# Patient Record
Sex: Male | Born: 1975 | Race: White | Hispanic: No | Smoking: Never smoker
Health system: Southern US, Community
[De-identification: ages and names within clinical notes are randomized; demographics above are authoritative.]

---

## 2015-01-08 ENCOUNTER — Encounter (HOSPITAL_COMMUNITY): Payer: Self-pay | Admitting: *Deleted

## 2015-01-08 ENCOUNTER — Emergency Department (HOSPITAL_COMMUNITY): Payer: BLUE CROSS/BLUE SHIELD

## 2015-01-08 DIAGNOSIS — Z880 Allergy status to penicillin: Secondary | ICD-10-CM | POA: Insufficient documentation

## 2015-01-08 DIAGNOSIS — R079 Chest pain, unspecified: Secondary | ICD-10-CM | POA: Diagnosis not present

## 2015-01-08 DIAGNOSIS — R1013 Epigastric pain: Secondary | ICD-10-CM | POA: Insufficient documentation

## 2015-01-08 DIAGNOSIS — R1110 Vomiting, unspecified: Secondary | ICD-10-CM | POA: Diagnosis not present

## 2015-01-08 DIAGNOSIS — R000 Tachycardia, unspecified: Secondary | ICD-10-CM | POA: Insufficient documentation

## 2015-01-08 DIAGNOSIS — R6883 Chills (without fever): Secondary | ICD-10-CM | POA: Diagnosis not present

## 2015-01-08 LAB — CBC
HEMATOCRIT: 44.6 % (ref 39.0–52.0)
HEMOGLOBIN: 15.6 g/dL (ref 13.0–17.0)
MCH: 29.4 pg (ref 26.0–34.0)
MCHC: 35 g/dL (ref 30.0–36.0)
MCV: 84.2 fL (ref 78.0–100.0)
Platelets: 172 10*3/uL (ref 150–400)
RBC: 5.3 MIL/uL (ref 4.22–5.81)
RDW: 12.4 % (ref 11.5–15.5)
WBC: 7.6 10*3/uL (ref 4.0–10.5)

## 2015-01-08 NOTE — ED Notes (Signed)
The pt is c/o cedntral chest pain since 1800 today.  Dizziness nausea sob.  He has had a cold that started today.  Very anxious  Constant sniffing

## 2015-01-09 ENCOUNTER — Encounter (HOSPITAL_COMMUNITY): Payer: Self-pay

## 2015-01-09 ENCOUNTER — Emergency Department (HOSPITAL_COMMUNITY): Payer: BLUE CROSS/BLUE SHIELD

## 2015-01-09 ENCOUNTER — Emergency Department (HOSPITAL_COMMUNITY)
Admission: EM | Admit: 2015-01-09 | Discharge: 2015-01-09 | Disposition: A | Discharge status: Home / Self Care | Payer: BLUE CROSS/BLUE SHIELD | Attending: Emergency Medicine | Admitting: Emergency Medicine

## 2015-01-09 DIAGNOSIS — R079 Chest pain, unspecified: Secondary | ICD-10-CM

## 2015-01-09 LAB — BASIC METABOLIC PANEL WITH GFR
Anion gap: 10 (ref 5–15)
BUN: 11 mg/dL (ref 6–20)
CO2: 23 mmol/L (ref 22–32)
Calcium: 9.3 mg/dL (ref 8.9–10.3)
Chloride: 105 mmol/L (ref 101–111)
Creatinine, Ser: 1.08 mg/dL (ref 0.61–1.24)
GFR calc Af Amer: >60 mL/min
GFR calc non Af Amer: >60 mL/min
Glucose, Bld: 116 mg/dL — ABNORMAL HIGH (ref 65–99)
Potassium: 3.3 mmol/L — ABNORMAL LOW (ref 3.5–5.1)
Sodium: 138 mmol/L (ref 135–145)

## 2015-01-09 LAB — D-DIMER, QUANTITATIVE: D-Dimer, Quant: 1.43 ug{FEU}/mL — ABNORMAL HIGH (ref 0.00–0.50)

## 2015-01-09 LAB — TROPONIN I: Troponin I: <0.03 ng/mL

## 2015-01-09 MED ORDER — IOHEXOL 350 MG/ML SOLN
100.0000 mL | Freq: Once | INTRAVENOUS | Status: AC | PRN
  Administered 2015-01-09: 60 mL via INTRAVENOUS

## 2015-01-09 MED ORDER — SODIUM CHLORIDE 0.9 % IV BOLUS (SEPSIS)
2000.0000 mL | Freq: Once | INTRAVENOUS | Status: AC
  Administered 2015-01-09: 2000 mL via INTRAVENOUS

## 2015-01-09 MED ORDER — KETOROLAC TROMETHAMINE 30 MG/ML IJ SOLN
30.0000 mg | Freq: Once | INTRAMUSCULAR | Status: AC
  Administered 2015-01-09: 30 mg via INTRAVENOUS
  Filled 2015-01-09: qty 1

## 2015-01-09 MED ORDER — GI COCKTAIL ~~LOC~~
30.0000 mL | Freq: Once | ORAL | Status: AC
  Administered 2015-01-09: 30 mL via ORAL
  Filled 2015-01-09: qty 30

## 2015-01-09 NOTE — ED Provider Notes (Signed)
CSN: [id number]     Arrival date & time 01/08/15  2311 History  By signing my name below, I, Soijett Blue, attest that this documentation has been prepared under the direction and in the presence of Melene Plan, DO. Electronically Signed: Soijett Blue, ED Scribe. 01/09/2015. 1:19 AM.   Chief Complaint  Patient presents with  . Chest Pain      The history is provided by the patient. No language interpreter was used.     HPI Comments: Lonnie Stewart is a 40 y.o. male who presents to the Emergency Department complaining of constant, non-radiating, squeezing, CP onset 6 PM today. He notes that he was getting off work when the CP began.  when the CP began.He states that he is having associated symptoms of abdominal pain, chills, and vomiting. He states that he has not tried any medications for the relief for his symptoms. He denies cough, congestion, hemoptysis, leg swelling, and any other symptoms. Blood clot, CA, recent long travel or immobilizations, or surgeries. Denies having CP in the past and denies sick contacts at this time. Denies smoking cigarettes, blood clots, HTN, high cholesterol, or DM at this time.   History reviewed. No pertinent past medical history. History reviewed. No pertinent past surgical history. No family history on file. Social History  Substance Use Topics  . Smoking status: Never Smoker   . Smokeless tobacco: None  . Alcohol Use: Yes    Review of Systems  Constitutional: Positive for chills. Negative for fever.  HENT: Negative for congestion and facial swelling.   Eyes: Negative for discharge and visual disturbance.  Respiratory: Negative for cough and shortness of breath.   Cardiovascular: Positive for chest pain. Negative for palpitations and leg swelling.  Gastrointestinal: Positive for vomiting. Negative for abdominal pain and diarrhea.  Musculoskeletal: Negative for myalgias and arthralgias.  Skin: Negative for color change and rash.  Neurological:  Negative for tremors, syncope and headaches.  Psychiatric/Behavioral: Negative for confusion and dysphoric mood.      Allergies  Penicillins  Home Medications   Prior to Admission medications   Not on File   BP 132/88 mmHg  Pulse 99  Temp(Src) 98.4 F (36.9 C)  Resp 18  Ht 6\' 1"  (1.854 m)  Wt 209 lb 4 oz (94.915 kg)  BMI 27.61 kg/m2  SpO2 100% Physical Exam  Constitutional: He is oriented to person, place, and time. He appears well-developed and well-nourished.  HENT:  Head: Normocephalic and atraumatic.  Eyes: EOM are normal. Pupils are equal, round, and reactive to light.  Neck: Normal range of motion. Neck supple. No JVD present.  Cardiovascular: Regular rhythm and normal heart sounds.  Tachycardia present.  Exam reveals no gallop and no friction rub.   No murmur heard. Equal peripheral pulses bilaterally.   Pulmonary/Chest: Effort normal and breath sounds normal. No respiratory distress. He has no wheezes. He has no rales.  Mild sternal tenderness. Lungs clear to auscultation.   Abdominal: Soft. He exhibits no distension. There is tenderness in the epigastric area. There is no rebound and no guarding.  Musculoskeletal: Normal range of motion.  No lower extremity edema  Neurological: He is alert and oriented to person, place, and time.  Skin: No rash noted. No pallor.  Psychiatric: He has a normal mood and affect. His behavior is normal.  Nursing note and vitals reviewed.   ED Course  Procedures (including critical care time) DIAGNOSTIC STUDIES: Oxygen Saturation is 97% on RA, nl by my interpretation.  COORDINATION OF CARE: 1:19 AM Discussed treatment plan with pt at bedside which includes labs, CXR and pt agreed to plan.    Labs Review Labs Reviewed  BASIC METABOLIC PANEL - Abnormal; Notable for the following:    Potassium 3.3 (*)    Glucose, Bld 116 (*)    All other components within normal limits  D-DIMER, QUANTITATIVE (NOT [facility]) - Abnormal;  Notable for the following:    D-Dimer, Quant 1.43 (*)    All other components within normal limits  CBC  TROPONIN I    Imaging Review Dg Chest 2 View  01/09/2015  CLINICAL DATA:  Acute onset of mid chest tightness and upper abdominal pain. Initial encounter. EXAM: CHEST  2 VIEW COMPARISON:  None. FINDINGS: The lungs are well-aerated and clear. There is no evidence of focal opacification, pleural effusion or pneumothorax. The heart is normal in size; the mediastinal contour is within normal limits. No acute osseous abnormalities are seen. IMPRESSION: No acute cardiopulmonary process seen. Electronically Signed   By: Roanna RaiderJeffery  Chang M.D.   On: 01/09/2015 00:09   Ct Angio Chest Pe W/cm &/or Wo Cm  01/09/2015  CLINICAL DATA:  Central chest pain for 1 day. Clinical concern for pulmonary embolus. EXAM: CT ANGIOGRAPHY CHEST WITH CONTRAST TECHNIQUE: Multidetector CT imaging of the chest was performed using the standard protocol during bolus administration of intravenous contrast. Multiplanar CT image reconstructions and MIPs were obtained to evaluate the vascular anatomy. CONTRAST:  60mL OMNIPAQUE IOHEXOL 350 MG/ML SOLN COMPARISON:  Chest radiographs 1 day prior. FINDINGS: Due to scanner malfunction, initial imaging terminated at the level of the lower mediastinum, partially excluding evaluation of the subsegmental branches of the lower lobes. Allowing for this, there are no filling defects within the pulmonary arteries to suggest pulmonary embolus. Thoracic aorta is normal in caliber. There is a conventional branching pattern from the aortic arch. The heart is normal in size. There is no mediastinal or hilar adenopathy. No pleural or pericardial effusion. Lower lobe bronchial thickening with depending ground-glass opacities, no confluent airspace disease. No pulmonary mass or suspicious nodule. Evaluation of the upper abdomen demonstrates splenomegaly, spleen measures at least 14.9 cm, partially included. No  evident acute abnormality. There are no acute or suspicious osseous abnormalities. Review of the MIP images confirms the above findings. IMPRESSION: 1. No evidence pulmonary embolus, allowing for limited evaluation of the distal subsegmental branches in the lower lobe secondary to technical limitations. 2. Central bronchial thickening. 3. Incidental note of splenomegaly in the upper abdomen, partially included. Electronically Signed   By: Rubye OaksMelanie  Ehinger M.D.   On: 01/09/2015 03:59   I have personally reviewed and evaluated these images and lab results as part of my medical decision-making.   EKG Interpretation   Date/Time:  Tuesday January 08 2015 23:17:21 EST Ventricular Rate:  146 PR Interval:  124 QRS Duration: 88 QT Interval:  280 QTC Calculation: 436 R Axis:   105 Text Interpretation:  Sinus tachycardia Rightward axis Borderline ECG No  old tracing to compare Confirmed by Johm Pfannenstiel MD, DANIEL [phone]) on 01/08/2015  11:23:06 PM      MDM   Final diagnoses:  Chest pain, unspecified chest pain type    40 yo M with a cc of chest pain.  Feels like a central pressure.  Denies injury.  Significant tachycardia on arrival.  Sinus.  Improved without intervention, though still in 110 range.  Denies dehydration.  No PE risk factors.  DDimer +, PE scan negative.  Patients symptoms completely relieved with GI cocktail, suspect reflux.  Will start on zantac, PCP follow up.   I have discussed the diagnosis/risks/treatment options with the patient and family and believe the pt to be eligible for discharge home to follow-up with PCP. We also discussed returning to the ED immediately if new or worsening sx occur. We discussed the sx which are most concerning (e.g., sudden worsening pain, fever, inability to tolerate by mouth) that necessitate immediate return. Medications administered to the patient during their visit and any new prescriptions provided to the patient are listed below.  Medications given  during this visit Medications  sodium chloride 0.9 % bolus 2,000 mL (0 mLs Intravenous Stopped 01/09/15 0316)  ketorolac (TORADOL) 30 MG/ML injection 30 mg (30 mg Intravenous Given 01/09/15 0139)  gi cocktail (Maalox,Lidocaine,Donnatal) (30 mLs Oral Given 01/09/15 0139)  iohexol (OMNIPAQUE) 350 MG/ML injection 100 mL (60 mLs Intravenous Contrast Given 01/09/15 0330)    There are no discharge medications for this patient.   The patient appears reasonably screen and/or stabilized for discharge and I doubt any other medical condition or other [facility] requiring further screening, evaluation, or treatment in the ED at this time prior to discharge.     I personally performed the services described in this documentation, which was scribed in my presence. The recorded information has been reviewed and is accurate.     Melene Plan, DO 01/09/15 1745

## 2015-01-09 NOTE — Discharge Instructions (Signed)
Try zantac 150mg  twice a day. Nonspecific Chest Pain It is often hard to find the cause of chest pain. There is always a chance that your pain could be related to something serious, such as a heart attack or a blood clot in your lungs. Chest pain can also be caused by conditions that are not life-threatening. If you have chest pain, it is very important to follow up with your doctor.  HOME CARE  If you were prescribed an antibiotic medicine, finish it all even if you start to feel better.  Avoid any activities that cause chest pain.  Do not use any tobacco products, including cigarettes, chewing tobacco, or electronic cigarettes. If you need help quitting, ask your doctor.  Do not drink alcohol.  Take medicines only as told by your doctor.  Keep all follow-up visits as told by your doctor. This is important. This includes any further testing if your chest pain does not go away.  Your doctor may tell you to keep your head raised (elevated) while you sleep.  Make lifestyle changes as told by your doctor. These may include:  Getting regular exercise. Ask your doctor to suggest some activities that are safe for you.  Eating a heart-healthy diet. Your doctor or a diet specialist (dietitian) can help you to learn healthy eating options.  Maintaining a healthy weight.  Managing diabetes, if necessary.  Reducing stress. GET HELP IF:  Your chest pain does not go away, even after treatment.  You have a rash with blisters on your chest.  You have a fever. GET HELP RIGHT AWAY IF:  Your chest pain is worse.  You have an increasing cough, or you cough up blood.  You have severe belly (abdominal) pain.  You feel extremely weak.  You pass out (faint).  You have chills.  You have sudden, unexplained chest discomfort.  You have sudden, unexplained discomfort in your arms, back, neck, or jaw.  You have shortness of breath at any time.  You suddenly start to sweat, or your skin  gets clammy.  You feel nauseous.  You vomit.  You suddenly feel light-headed or dizzy.  Your heart begins to beat quickly, or it feels like it is skipping beats. These symptoms may be an emergency. Do not wait to see if the symptoms will go away. Get medical help right away. Call your local emergency services (911 in the [place] Do not drive yourself to the hospital.   This information is not intended to replace advice given to you by your health care provider. Make sure you discuss any questions you have with your health care provider.   Document Released: 06/10/2007 Document Revised: 01/12/2014 Document Reviewed: 07/28/2013 Elsevier Interactive Patient Education [employer]2016 Elsevier Inc.

## 2015-01-09 NOTE — ED Notes (Signed)
Patient transported to CT 

## 2015-01-18 ENCOUNTER — Ambulatory Visit: Payer: Self-pay | Admitting: *Deleted

## 2017-12-28 ENCOUNTER — Emergency Department (HOSPITAL_COMMUNITY): Payer: BLUE CROSS/BLUE SHIELD

## 2017-12-28 ENCOUNTER — Observation Stay (HOSPITAL_COMMUNITY): Payer: BLUE CROSS/BLUE SHIELD | Admitting: Certified Registered"

## 2017-12-28 ENCOUNTER — Encounter (HOSPITAL_COMMUNITY): Payer: Self-pay | Admitting: Emergency Medicine

## 2017-12-28 ENCOUNTER — Other Ambulatory Visit: Payer: Self-pay

## 2017-12-28 ENCOUNTER — Observation Stay (HOSPITAL_COMMUNITY)
Admission: EM | Admit: 2017-12-28 | Discharge: 2017-12-29 | Disposition: A | Discharge status: Home / Self Care | Payer: BLUE CROSS/BLUE SHIELD | Attending: Surgery | Admitting: Surgery

## 2017-12-28 ENCOUNTER — Encounter (HOSPITAL_COMMUNITY)
Admission: EM | Disposition: A | Discharge status: Home / Self Care | Payer: Self-pay | Source: Home / Self Care | Attending: Emergency Medicine

## 2017-12-28 DIAGNOSIS — K37 Unspecified appendicitis: Secondary | ICD-10-CM | POA: Diagnosis present

## 2017-12-28 DIAGNOSIS — Z880 Allergy status to penicillin: Secondary | ICD-10-CM | POA: Insufficient documentation

## 2017-12-28 DIAGNOSIS — K3580 Unspecified acute appendicitis: Principal | ICD-10-CM | POA: Insufficient documentation

## 2017-12-28 HISTORY — PX: LAPAROSCOPIC APPENDECTOMY: SHX408

## 2017-12-28 LAB — CBC WITH DIFFERENTIAL/PLATELET
ABS IMMATURE GRANULOCYTES: 0.06 10*3/uL (ref 0.00–0.07)
BASOS ABS: 0 10*3/uL (ref 0.0–0.1)
Basophils Relative: 0 %
Eosinophils Absolute: 0.1 10*3/uL (ref 0.0–0.5)
Eosinophils Relative: 0 %
HCT: 44.9 % (ref 39.0–52.0)
Hemoglobin: 15 g/dL (ref 13.0–17.0)
Immature Granulocytes: 0 %
LYMPHS ABS: 1.1 10*3/uL (ref 0.7–4.0)
LYMPHS PCT: 8 %
MCH: 28.7 pg (ref 26.0–34.0)
MCHC: 33.4 g/dL (ref 30.0–36.0)
MCV: 86 fL (ref 80.0–100.0)
MONO ABS: 1.2 10*3/uL — AB (ref 0.1–1.0)
Monocytes Relative: 8 %
Neutro Abs: 12.2 10*3/uL — ABNORMAL HIGH (ref 1.7–7.7)
Neutrophils Relative %: 84 %
Platelets: 193 10*3/uL (ref 150–400)
RBC: 5.22 MIL/uL (ref 4.22–5.81)
RDW: 12 % (ref 11.5–15.5)
WBC: 14.7 10*3/uL — AB (ref 4.0–10.5)
nRBC: 0 % (ref 0.0–0.2)

## 2017-12-28 LAB — COMPREHENSIVE METABOLIC PANEL
ALBUMIN: 3.7 g/dL (ref 3.5–5.0)
ALT: 28 U/L (ref 0–44)
AST: 20 U/L (ref 15–41)
Alkaline Phosphatase: 60 U/L (ref 38–126)
Anion gap: 12 (ref 5–15)
BUN: 15 mg/dL (ref 6–20)
CHLORIDE: 104 mmol/L (ref 98–111)
CO2: 22 mmol/L (ref 22–32)
CREATININE: 1.17 mg/dL (ref 0.61–1.24)
Calcium: 8.7 mg/dL — ABNORMAL LOW (ref 8.9–10.3)
GFR calc non Af Amer: >60 mL/min (ref >60)
Glucose, Bld: 110 mg/dL — ABNORMAL HIGH (ref 70–99)
Potassium: 3.4 mmol/L — ABNORMAL LOW (ref 3.5–5.1)
Sodium: 138 mmol/L (ref 135–145)
Total Bilirubin: 1.7 mg/dL — ABNORMAL HIGH (ref 0.3–1.2)
Total Protein: 6.8 g/dL (ref 6.5–8.1)

## 2017-12-28 LAB — I-STAT CG4 LACTIC ACID, ED: LACTIC ACID, VENOUS: 1.76 mmol/L (ref 0.5–1.9)

## 2017-12-28 LAB — LIPASE, BLOOD: Lipase: 24 U/L (ref 11–51)

## 2017-12-28 SURGERY — APPENDECTOMY, LAPAROSCOPIC
Anesthesia: General | Site: Abdomen

## 2017-12-28 MED ORDER — HYDROMORPHONE HCL 1 MG/ML IJ SOLN
1.0000 mg | Freq: Once | INTRAMUSCULAR | Status: AC
  Administered 2017-12-28: 1 mg via INTRAVENOUS
  Filled 2017-12-28: qty 1

## 2017-12-28 MED ORDER — SODIUM CHLORIDE 0.9 % IV SOLN
2.0000 g | INTRAVENOUS | Status: DC
  Administered 2017-12-28: 2 g via INTRAVENOUS
  Filled 2017-12-28 (×2): qty 20

## 2017-12-28 MED ORDER — SUCCINYLCHOLINE CHLORIDE 200 MG/10ML IV SOSY
PREFILLED_SYRINGE | INTRAVENOUS | Status: DC | PRN
  Administered 2017-12-28: 120 mg via INTRAVENOUS

## 2017-12-28 MED ORDER — ONDANSETRON HCL 4 MG/2ML IJ SOLN
INTRAMUSCULAR | Status: AC
  Filled 2017-12-28: qty 2

## 2017-12-28 MED ORDER — OXYCODONE HCL 5 MG PO TABS
5.0000 mg | ORAL_TABLET | Freq: Once | ORAL | Status: AC | PRN
  Administered 2017-12-28: 5 mg via ORAL

## 2017-12-28 MED ORDER — KETOROLAC TROMETHAMINE 30 MG/ML IJ SOLN
INTRAMUSCULAR | Status: AC
  Filled 2017-12-28: qty 1

## 2017-12-28 MED ORDER — PROPOFOL 10 MG/ML IV BOLUS
INTRAVENOUS | Status: DC | PRN
  Administered 2017-12-28: 150 mg via INTRAVENOUS

## 2017-12-28 MED ORDER — KETOROLAC TROMETHAMINE 30 MG/ML IJ SOLN
30.0000 mg | Freq: Once | INTRAMUSCULAR | Status: AC | PRN
  Administered 2017-12-28: 30 mg via INTRAVENOUS

## 2017-12-28 MED ORDER — HYDROMORPHONE HCL 1 MG/ML IJ SOLN: 0.2500 mg | INTRAMUSCULAR | Status: DC | PRN

## 2017-12-28 MED ORDER — SODIUM CHLORIDE 0.9 % IV SOLN
1000.0000 mL | INTRAVENOUS | Status: DC
  Administered 2017-12-28: 500 mL via INTRAVENOUS

## 2017-12-28 MED ORDER — ESMOLOL HCL 100 MG/10ML IV SOLN
INTRAVENOUS | Status: DC | PRN
  Administered 2017-12-28: 15 mg via INTRAVENOUS

## 2017-12-28 MED ORDER — PROMETHAZINE HCL 25 MG/ML IJ SOLN: 6.2500 mg | INTRAMUSCULAR | Status: DC | PRN

## 2017-12-28 MED ORDER — FENTANYL CITRATE (PF) 250 MCG/5ML IJ SOLN
INTRAMUSCULAR | Status: AC
  Filled 2017-12-28: qty 5

## 2017-12-28 MED ORDER — ONDANSETRON 4 MG PO TBDP: 4.0000 mg | ORAL_TABLET | Freq: Four times a day (QID) | ORAL | Status: DC | PRN

## 2017-12-28 MED ORDER — SODIUM CHLORIDE 0.9 % IV SOLN
INTRAVENOUS | Status: DC
  Administered 2017-12-28 – 2017-12-29 (×2): via INTRAVENOUS

## 2017-12-28 MED ORDER — METRONIDAZOLE IN NACL 5-0.79 MG/ML-% IV SOLN
500.0000 mg | Freq: Once | INTRAVENOUS | Status: AC
  Administered 2017-12-28: 500 mg via INTRAVENOUS
  Filled 2017-12-28: qty 100

## 2017-12-28 MED ORDER — ACETAMINOPHEN 10 MG/ML IV SOLN
INTRAVENOUS | Status: AC
  Filled 2017-12-28: qty 100

## 2017-12-28 MED ORDER — SODIUM CHLORIDE 0.9 % IV BOLUS (SEPSIS)
1000.0000 mL | Freq: Once | INTRAVENOUS | Status: AC
  Administered 2017-12-28: 1000 mL via INTRAVENOUS

## 2017-12-28 MED ORDER — SODIUM CHLORIDE 0.9 % IV BOLUS
1000.0000 mL | Freq: Once | INTRAVENOUS | Status: AC
  Administered 2017-12-28: 1000 mL via INTRAVENOUS

## 2017-12-28 MED ORDER — KETOROLAC TROMETHAMINE 30 MG/ML IJ SOLN: 30.0000 mg | Freq: Four times a day (QID) | INTRAMUSCULAR | Status: DC | PRN

## 2017-12-28 MED ORDER — DEXAMETHASONE SODIUM PHOSPHATE 10 MG/ML IJ SOLN
INTRAMUSCULAR | Status: DC | PRN
  Administered 2017-12-28: 10 mg via INTRAVENOUS

## 2017-12-28 MED ORDER — MIDAZOLAM HCL 2 MG/2ML IJ SOLN
INTRAMUSCULAR | Status: DC | PRN
  Administered 2017-12-28: 2 mg via INTRAVENOUS

## 2017-12-28 MED ORDER — OXYCODONE HCL 5 MG/5ML PO SOLN: 5.0000 mg | Freq: Once | ORAL | Status: AC | PRN

## 2017-12-28 MED ORDER — HYDRALAZINE HCL 20 MG/ML IJ SOLN: 10.0000 mg | INTRAMUSCULAR | Status: DC | PRN

## 2017-12-28 MED ORDER — METOPROLOL TARTRATE 5 MG/5ML IV SOLN: 5.0000 mg | Freq: Four times a day (QID) | INTRAVENOUS | Status: DC | PRN

## 2017-12-28 MED ORDER — BUPIVACAINE-EPINEPHRINE 0.25% -1:200000 IJ SOLN
INTRAMUSCULAR | Status: DC | PRN
  Administered 2017-12-28: 10 mL

## 2017-12-28 MED ORDER — FENTANYL CITRATE (PF) 250 MCG/5ML IJ SOLN
INTRAMUSCULAR | Status: DC | PRN
  Administered 2017-12-28: 50 ug via INTRAVENOUS

## 2017-12-28 MED ORDER — SUGAMMADEX SODIUM 200 MG/2ML IV SOLN
INTRAVENOUS | Status: DC | PRN
  Administered 2017-12-28: 200 mg via INTRAVENOUS

## 2017-12-28 MED ORDER — ONDANSETRON HCL 4 MG/2ML IJ SOLN
INTRAMUSCULAR | Status: DC | PRN
  Administered 2017-12-28: 4 mg via INTRAVENOUS

## 2017-12-28 MED ORDER — METHOCARBAMOL 500 MG PO TABS: 500.0000 mg | ORAL_TABLET | Freq: Four times a day (QID) | ORAL | Status: DC | PRN

## 2017-12-28 MED ORDER — ONDANSETRON HCL 4 MG/2ML IJ SOLN: 4.0000 mg | Freq: Four times a day (QID) | INTRAMUSCULAR | Status: DC | PRN

## 2017-12-28 MED ORDER — DOCUSATE SODIUM 100 MG PO CAPS
100.0000 mg | ORAL_CAPSULE | Freq: Two times a day (BID) | ORAL | Status: DC
  Administered 2017-12-28 – 2017-12-29 (×2): 100 mg via ORAL
  Filled 2017-12-28 (×2): qty 1

## 2017-12-28 MED ORDER — ACETAMINOPHEN 500 MG PO TABS
1000.0000 mg | ORAL_TABLET | Freq: Four times a day (QID) | ORAL | Status: DC
  Administered 2017-12-29 (×3): 1000 mg via ORAL
  Filled 2017-12-28 (×3): qty 2

## 2017-12-28 MED ORDER — BUPIVACAINE-EPINEPHRINE (PF) 0.25% -1:200000 IJ SOLN
INTRAMUSCULAR | Status: AC
  Filled 2017-12-28: qty 30

## 2017-12-28 MED ORDER — LIDOCAINE 2% (20 MG/ML) 5 ML SYRINGE
INTRAMUSCULAR | Status: DC | PRN
  Administered 2017-12-28: 60 mg via INTRAVENOUS

## 2017-12-28 MED ORDER — 0.9 % SODIUM CHLORIDE (POUR BTL) OPTIME
TOPICAL | Status: DC | PRN
  Administered 2017-12-28: 1000 mL

## 2017-12-28 MED ORDER — ROCURONIUM BROMIDE 10 MG/ML (PF) SYRINGE
PREFILLED_SYRINGE | INTRAVENOUS | Status: DC | PRN
  Administered 2017-12-28: 50 mg via INTRAVENOUS

## 2017-12-28 MED ORDER — IOHEXOL 300 MG/ML  SOLN
100.0000 mL | Freq: Once | INTRAMUSCULAR | Status: AC | PRN
  Administered 2017-12-28: 100 mL via INTRAVENOUS

## 2017-12-28 MED ORDER — DIPHENHYDRAMINE HCL 25 MG PO CAPS: 25.0000 mg | ORAL_CAPSULE | Freq: Four times a day (QID) | ORAL | Status: DC | PRN

## 2017-12-28 MED ORDER — ONDANSETRON HCL 4 MG/2ML IJ SOLN
4.0000 mg | Freq: Once | INTRAMUSCULAR | Status: AC
  Administered 2017-12-28: 4 mg via INTRAVENOUS
  Filled 2017-12-28: qty 2

## 2017-12-28 MED ORDER — TRAMADOL HCL 50 MG PO TABS
50.0000 mg | ORAL_TABLET | Freq: Four times a day (QID) | ORAL | Status: DC | PRN
  Administered 2017-12-29: 50 mg via ORAL
  Filled 2017-12-28: qty 1

## 2017-12-28 MED ORDER — CIPROFLOXACIN IN D5W 400 MG/200ML IV SOLN
400.0000 mg | Freq: Once | INTRAVENOUS | Status: AC
Start: 2017-12-28 — End: 2017-12-28
  Administered 2017-12-28: 400 mg via INTRAVENOUS
  Filled 2017-12-28: qty 200

## 2017-12-28 MED ORDER — DEXAMETHASONE SODIUM PHOSPHATE 10 MG/ML IJ SOLN
INTRAMUSCULAR | Status: AC
  Filled 2017-12-28: qty 1

## 2017-12-28 MED ORDER — LACTATED RINGERS IV SOLN
INTRAVENOUS | Status: DC | PRN
  Administered 2017-12-28 (×2): via INTRAVENOUS

## 2017-12-28 MED ORDER — METRONIDAZOLE IN NACL 5-0.79 MG/ML-% IV SOLN
500.0000 mg | Freq: Three times a day (TID) | INTRAVENOUS | Status: DC
  Administered 2017-12-28 – 2017-12-29 (×3): 500 mg via INTRAVENOUS
  Filled 2017-12-28 (×3): qty 100

## 2017-12-28 MED ORDER — ENOXAPARIN SODIUM 40 MG/0.4ML ~~LOC~~ SOLN
40.0000 mg | SUBCUTANEOUS | Status: DC
  Filled 2017-12-28: qty 0.4

## 2017-12-28 MED ORDER — BISACODYL 10 MG RE SUPP: 10.0000 mg | Freq: Every day | RECTAL | Status: DC | PRN

## 2017-12-28 MED ORDER — HYDROMORPHONE HCL 1 MG/ML IJ SOLN: 0.5000 mg | Freq: Four times a day (QID) | INTRAMUSCULAR | Status: DC | PRN

## 2017-12-28 MED ORDER — LIDOCAINE 2% (20 MG/ML) 5 ML SYRINGE
INTRAMUSCULAR | Status: AC
  Filled 2017-12-28: qty 5

## 2017-12-28 MED ORDER — PROPOFOL 10 MG/ML IV BOLUS
INTRAVENOUS | Status: AC
  Filled 2017-12-28: qty 20

## 2017-12-28 MED ORDER — DIPHENHYDRAMINE HCL 50 MG/ML IJ SOLN: 25.0000 mg | Freq: Four times a day (QID) | INTRAMUSCULAR | Status: DC | PRN

## 2017-12-28 MED ORDER — OXYCODONE HCL 5 MG PO TABS
ORAL_TABLET | ORAL | Status: AC
  Filled 2017-12-28: qty 1

## 2017-12-28 MED ORDER — MORPHINE SULFATE (PF) 4 MG/ML IV SOLN
4.0000 mg | Freq: Once | INTRAVENOUS | Status: AC
  Administered 2017-12-28: 4 mg via INTRAVENOUS
  Filled 2017-12-28: qty 1

## 2017-12-28 MED ORDER — MIDAZOLAM HCL 2 MG/2ML IJ SOLN
INTRAMUSCULAR | Status: AC
  Filled 2017-12-28: qty 2

## 2017-12-28 MED ORDER — SODIUM CHLORIDE 0.9 % IR SOLN
Status: DC | PRN
  Administered 2017-12-28: 1000 mL

## 2017-12-28 MED ORDER — ACETAMINOPHEN 10 MG/ML IV SOLN
INTRAVENOUS | Status: DC | PRN
  Administered 2017-12-28: 1000 mg via INTRAVENOUS

## 2017-12-28 MED ORDER — ESMOLOL HCL 100 MG/10ML IV SOLN
INTRAVENOUS | Status: AC
  Filled 2017-12-28: qty 10

## 2017-12-28 SURGICAL SUPPLY — 38 items
APPLIER CLIP 5 13 M/L LIGAMAX5 (MISCELLANEOUS)
BLADE CLIPPER SURG (BLADE) ×2 IMPLANT
CANISTER SUCT 3000ML PPV (MISCELLANEOUS) ×2 IMPLANT
CHLORAPREP W/TINT 26ML (MISCELLANEOUS) ×2 IMPLANT
CLIP APPLIE 5 13 M/L LIGAMAX5 (MISCELLANEOUS) IMPLANT
COVER SURGICAL LIGHT HANDLE (MISCELLANEOUS) ×4 IMPLANT
COVER WAND RF STERILE (DRAPES) ×2 IMPLANT
CUTTER ENDO LINEAR 45M (STAPLE) ×2 IMPLANT
DERMABOND ADVANCED (GAUZE/BANDAGES/DRESSINGS) ×1
DERMABOND ADVANCED .7 DNX12 (GAUZE/BANDAGES/DRESSINGS) ×1 IMPLANT
DEVICE PMI PUNCTURE CLOSURE (MISCELLANEOUS) ×2 IMPLANT
ELECT REM PT RETURN 9FT ADLT (ELECTROSURGICAL) ×2
ELECTRODE REM PT RTRN 9FT ADLT (ELECTROSURGICAL) ×1 IMPLANT
GLOVE BIO SURGEON STRL SZ 6 (GLOVE) ×2 IMPLANT
GLOVE INDICATOR 6.5 STRL GRN (GLOVE) ×2 IMPLANT
GOWN STRL REUS W/ TWL LRG LVL3 (GOWN DISPOSABLE) ×3 IMPLANT
GOWN STRL REUS W/TWL LRG LVL3 (GOWN DISPOSABLE) ×3
KIT BASIN OR (CUSTOM PROCEDURE TRAY) ×2 IMPLANT
KIT TURNOVER KIT B (KITS) ×2 IMPLANT
NEEDLE INSUFFLATION 14GA 120MM (NEEDLE) ×2 IMPLANT
NS IRRIG 1000ML POUR BTL (IV SOLUTION) ×2 IMPLANT
PAD ARMBOARD 7.5X6 YLW CONV (MISCELLANEOUS) ×4 IMPLANT
POUCH SPECIMEN RETRIEVAL 10MM (ENDOMECHANICALS) ×2 IMPLANT
RELOAD 45 VASCULAR/THIN (ENDOMECHANICALS) IMPLANT
RELOAD STAPLE TA45 3.5 REG BLU (ENDOMECHANICALS) IMPLANT
SCISSORS ENDO CVD 5DCS (MISCELLANEOUS) IMPLANT
SET IRRIG TUBING LAPAROSCOPIC (IRRIGATION / IRRIGATOR) ×2 IMPLANT
SHEARS HARMONIC ACE PLUS 36CM (ENDOMECHANICALS) IMPLANT
SLEEVE ENDOPATH XCEL 5M (ENDOMECHANICALS) ×2 IMPLANT
SPECIMEN JAR SMALL (MISCELLANEOUS) ×2 IMPLANT
SUT MNCRL AB 4-0 PS2 18 (SUTURE) ×2 IMPLANT
TOWEL OR 17X24 6PK STRL BLUE (TOWEL DISPOSABLE) ×2 IMPLANT
TRAY FOLEY CATH SILVER 16FR (SET/KITS/TRAYS/PACK) ×2 IMPLANT
TRAY LAPAROSCOPIC MC (CUSTOM PROCEDURE TRAY) ×2 IMPLANT
TROCAR XCEL 12X100 BLDLESS (ENDOMECHANICALS) ×2 IMPLANT
TROCAR XCEL NON-BLD 5MMX100MML (ENDOMECHANICALS) ×2 IMPLANT
TUBING INSUFFLATION (TUBING) ×2 IMPLANT
WATER STERILE IRR 1000ML POUR (IV SOLUTION) ×2 IMPLANT

## 2017-12-28 NOTE — ED Triage Notes (Signed)
Pt report r lower abd pain since Sunday, unrelieved by advil on Sunday and Monday. States he was seen at urgent on yesterday and was instructed to come to ER and decided not to.

## 2017-12-28 NOTE — Op Note (Signed)
Operative Report  Lonnie Stewart 42 y.o. malTarri Glenne  [id number]  [id number]  12/28/2017  Surgeon: Berna Buehelsea A Karrington Mccravy   Assistant: none  Procedure performed: Laparoscopic Appendectomy  Preop diagnosis: Acute appendicitis  Post-op diagnosis/intraop findings: Acute appendicitis - with localized peritonitis   Specimens: appendix  EBL: minimal  Complications: none  Description of procedure: After obtaining informed consent the patient was brought to the operating room. Antibiotics were administered. SCD's were applied. General endotracheal anesthesia was initiated and a formal time-out was performed. Foley catheter inserted which was removed at the end of the case. The abdomen was prepped and draped in the usual sterile fashion and the abdomen was entered using an infraumbilical Veress needle and insufflated to 15 mmHg. A 5 mm trocar and camera were then introduced, the abdomen was inspected and there is no evidence of injury from our entry. A suprapubic 5 mm trocar and a left lower quadrant 12 mm trocar were introduced under direct visualization following infiltration with local. The patient was then placed in Trendelenburg and rotated to the left and the small bowel was reflected cephalad. There were dense inflammatory adhesions in the region of the appendix which were carefully gently bluntly lysed. The appendix was visualized: it is acutely inflamed, dilated, with woody fibrotic changes and curled back on itself into a retrocecal position. There is a purulent rind that no free fluid collection. A combination of blunt dissection and Harmonic scalpel were used to free it of its retroperitoneal attachments. Great care was taken to ensure no injury to surrounding retroperitoneal structures, cecum or terminal ileum. The Harmonic scalpel was then used to sequentially divide the appendiceal mesentery down to the base of the appendix which remained thickened and fibrotic with inflammation extending onto  the base of the cecum. The base of the appendix was transected with a blue load linear cutting stapler. The staple line appeared hemostatic and viable. Hemostasis was ensured. The appendix was placed in an Endo Catch bag and removed through our 12 mm trocar site. The right lower quadrant was then again inspected and confirmed to be hemostatic. This was irrigated gently and the aspirate was clear. The small bowel was run from the terminal ileum proximally for several feet and no other abnormalities were identified. The colon is full of stool but otherwise unremarkable in appearance. The gallbladder and liver appeared normal. The 12mm trocar site in the left lower quadrant was closed with a 0 vicryl in the fascia under direct visualization using a PMI device. The abdomen was desufflated and all trocars removed. The skin incisions were closed with running subcuticular monocryl and Dermabond. The patient was awakened, extubated and transported to the recovery room in stable condition.   All counts were correct at the completion of the case.

## 2017-12-28 NOTE — H&P (Signed)
Surgical H&P  CC: abdominal pain  HPI: very pleasant 42yo man with no known medical history or prior abdominal surgery presents to ER today with abdominal pain. Pain began late Sunday evening/ early Monday morning and was vague and central crampiness. It seemed to get a little better but then worsened, has become more of a sharp pain more focally in the right lower quadrant, he went to urgent care and was advised to come to ER. Reports low-grade temps at home and intermittent nausea but no emesis. No diarrhea or constipation. Reports anorexia, last intake was some crackers around 9am today. Denies urinary symptoms. His evaluation in the ER is notable for tachycardia and hypertension, WBC 14.7, CT positive for non-perforated appendicitis.   Allergies  Allergen Reactions  . Penicillins Other (See Comments)    Unknown childhood reaction DID THE REACTION INVOLVE: Swelling of the face/tongue/throat, SOB, or low BP? Unknown Sudden or severe rash/hives, skin peeling, or the inside of the mouth or nose? Unknown Did it require medical treatment? Unknown When did it last happen? If all above answers are "NO", may proceed with cephalosporin use.     History reviewed. No pertinent past medical history.  History reviewed. No pertinent surgical history.  No family history on file.  Social History   Socioeconomic History  . Marital status: Unknown    Spouse name: Not on file  . Number of children: Not on file  . Years of education: Not on file  . Highest education level: Not on file  Occupational History  . Not on file  Social Needs  . Financial resource strain: Not on file  . Food insecurity:    Worry: Not on file    Inability: Not on file  . Transportation needs:    Medical: Not on file    Non-medical: Not on file  Tobacco Use  . Smoking status: Never Smoker  . Smokeless tobacco: Never Used  Substance and Sexual Activity  . Alcohol use: Yes  . Drug use: Never  . Sexual  activity: Not on file  Lifestyle  . Physical activity:    Days per week: Not on file    Minutes per session: Not on file  . Stress: Not on file  Relationships  . Social connections:    Talks on phone: Not on file    Gets together: Not on file    Attends religious service: Not on file    Active member of club or organization: Not on file    Attends meetings of clubs or organizations: Not on file    Relationship status: Not on file  Other Topics Concern  . Not on file  Social History Narrative  . Not on file    No current facility-administered medications on file prior to encounter.    Current Outpatient Medications on File Prior to Encounter  Medication Sig Dispense Refill  . acetaminophen (TYLENOL) 500 MG tablet Take 1,000 mg by mouth every 6 (six) hours as needed for mild pain.    Marland Kitchen. ibuprofen (ADVIL,MOTRIN) 200 MG tablet Take 200 mg by mouth every 6 (six) hours as needed for moderate pain.       Review of Systems: a complete, 10pt review of systems was completed with pertinent positives and negatives as documented in the HPI  Physical Exam: Vitals:   12/28/17 1545 12/28/17 1600  BP: (!) 161/104 (!) 162/106  Pulse: (!) 129 (!) 128  Resp: 15 17  Temp:    SpO2: 98% 95%  Gen: A&Ox3, no distress  Head: normocephalic, atraumatic Eyes: extraocular motions intact, anicteric.  Neck: supple without mass or thyromegaly Chest: unlabored respirations, symmetrical air entry, clear bilaterally   Cardiovascular: RRR with palpable distal pulses, no pedal edema Abdomen: soft, nondistended, tender periumbilical and RLQ with voluntary guarding. No mass or organomegaly.  Extremities: warm, without edema, no deformities  Neuro: grossly intact Psych: appropriate mood and affect, normal insight  Skin: warm and dry   CBC Latest Ref Rng & Units 12/28/2017 01/08/2015  WBC 4.0 - 10.5 K/uL 14.7(H) 7.6  Hemoglobin 13.0 - 17.0 g/dL 16.115.0 09.615.6  Hematocrit 04.539.0 - 52.0 % 44.9 44.6  Platelets 150  - 400 K/uL 193 172    CMP Latest Ref Rng & Units 12/28/2017 01/08/2015  Glucose 70 - 99 mg/dL 409(W110(H) 119(J116(H)  BUN 6 - 20 mg/dL 15 11  Creatinine 4.780.61 - 1.24 mg/dL 2.951.17 6.211.08  Sodium 308135 - 145 mmol/L 138 138  Potassium 3.5 - 5.1 mmol/L 3.4(L) 3.3(L)  Chloride 98 - 111 mmol/L 104 105  CO2 22 - 32 mmol/L 22 23  Calcium 8.9 - 10.3 mg/dL 6.5(H8.7(L) 9.3  Total Protein 6.5 - 8.1 g/dL 6.8 -  Total Bilirubin 0.3 - 1.2 mg/dL 8.4(O1.7(H) -  Alkaline Phos 38 - 126 U/L 60 -  AST 15 - 41 U/L 20 -  ALT 0 - 44 U/L 28 -    No results found for: INR, PROTIME  Imaging: Ct Abdomen Pelvis W Contrast  Result Date: 12/28/2017 CLINICAL DATA:  Right lower quadrant pain x2 days with fever and nausea. EXAM: CT ABDOMEN AND PELVIS WITH CONTRAST TECHNIQUE: Multidetector CT imaging of the abdomen and pelvis was performed using the standard protocol following bolus administration of intravenous contrast. CONTRAST:  100mL OMNIPAQUE IOHEXOL 300 MG/ML  SOLN COMPARISON:  None. FINDINGS: Lower chest: No acute abnormality. Hepatobiliary: No focal liver abnormality is seen. No gallstones, gallbladder wall thickening, or biliary dilatation. Pancreas: Unremarkable. No pancreatic ductal dilatation or surrounding inflammatory changes. Spleen: Normal in size without focal abnormality. Adrenals/Urinary Tract: Adrenal glands are unremarkable. Kidneys are normal, without renal calculi, focal lesion, or hydronephrosis. Bladder is unremarkable. Stomach/Bowel: Appendix: Location: McBurney's point Diameter: 13 mm Appendicolith: None Mucosal hyper-enhancement: Yes Extraluminal gas: None Periappendiceal collection: Moderate periappendiceal inflammation, edema trace fluid without abscess. Decompressed stomach with small hiatal hernia. Normal small bowel rotation. No bowel obstruction. Mild submucosal fat deposition within the distal terminal ileum. Moderate stool retention the right colon. Scattered descending sigmoid diverticulosis without acute  diverticulitis. Vascular/Lymphatic: No significant vascular findings are present. No enlarged abdominal or pelvic lymph nodes. Reproductive: Prostate is unremarkable. Other: No free air. Musculoskeletal: No acute or significant osseous findings. IMPRESSION: Acute appendicitis with moderate periappendiceal inflammation and trace fluid. No abscess or free air. Electronically Signed   By: Tollie Ethavid  Kwon M.D.   On: 12/28/2017 14:44      A/P: 42yo man with acute appendicitis. I recommend proceeding with laparoscopic appendectomy. We discussed the surgery including risks of bleeding, infection, pain, scarring, injury to intra-abdominal structures, conversion to open surgery or more extensive resection, risk of staple line leak or delayed abscess, failure to resolve symptoms, postoperative ileus, incisional hernia, as well as general risks of DVT/PE, pneumonia, stroke, heart attack, death. Questions were welcomed and answered to the patient's satisfaction. We'll proceed to the operating room today.    Phylliss Blakeshelsea Salene Mohamud, MD [facility], [place] Pager [phone]

## 2017-12-28 NOTE — Transfer of Care (Signed)
Immediate Anesthesia Transfer of Care Note  Patient: Lonnie GlennRobert Flenner  Procedure(s) Performed: APPENDECTOMY LAPAROSCOPIC (N/A Abdomen)  Patient Location: PACU  Anesthesia Type:General  Level of Consciousness: awake, alert  and oriented  Airway & Oxygen Therapy: Patient Spontanous Breathing and Patient connected to face mask oxygen  Post-op Assessment: Report given to RN and Post -op Vital signs reviewed and stable  Post vital signs: Reviewed and stable  Last Vitals:  Vitals Value Taken Time  BP 133/84 12/28/2017  6:47 PM  Temp    Pulse 116 12/28/2017  6:48 PM  Resp 16 12/28/2017  6:48 PM  SpO2 100 % 12/28/2017  6:48 PM  Vitals shown include unvalidated device data.  Last Pain:  Vitals:   12/28/17 1032  TempSrc:   PainSc: 5          Complications: No apparent anesthesia complications

## 2017-12-28 NOTE — OR Nursing (Signed)
PERSONAL BELONGINGS SENT TO PACU FROM SHORT STAY

## 2017-12-28 NOTE — Anesthesia Procedure Notes (Signed)
Procedure Name: Intubation Date/Time: 12/28/2017 5:51 PM Performed by: Izola Priceockfield, Nicol Herbig Walton Jr., CRNA Pre-anesthesia Checklist: Patient identified, Emergency Drugs available, Suction available and Patient being monitored Patient Re-evaluated:Patient Re-evaluated prior to induction Oxygen Delivery Method: Circle system utilized Preoxygenation: Pre-oxygenation with 100% oxygen Induction Type: IV induction Ventilation: Mask ventilation without difficulty Laryngoscope Size: Glidescope and 4 Grade View: Grade I Tube type: Oral Tube size: 7.5 mm Number of attempts: 1 Airway Equipment and Method: Stylet and Video-laryngoscopy Placement Confirmation: ETT inserted through vocal cords under direct vision,  positive ETCO2 and breath sounds checked- equal and bilateral Secured at: 23 cm Tube secured with: Tape Dental Injury: Teeth and Oropharynx as per pre-operative assessment

## 2017-12-28 NOTE — ED Notes (Signed)
Pt transported to CT ?

## 2017-12-28 NOTE — ED Provider Notes (Signed)
[facility] Provider Note   CSN: [id number] Arrival date & time: 12/28/17  1015     History   Chief Complaint Chief Complaint  Patient presents with  . Abdominal Pain    HPI Lonnie Stewart is a 42 y.o. male.  HPI Patient started getting abdominal pain 2 days ago.  It was aching and fairly generalized but then became more localized to his central and right lower abdomen.  He tried to continue finishing his day at work and some ibuprofen.  He went to be seen at urgent care and was advised to come to the emergency department for evaluation of possible appendicitis.  He reports that last night for little while it seemed to be getting somewhat better.  He reports today however it is worse and persistent.  Not had a fever.  He became very nauseated yesterday but did not develop vomiting.  No diarrhea.  No pain burning urgency with urination.  No surgical history.  He reports he had loss of appetite.  He has not eaten since yesterday evening. History reviewed. No pertinent past medical history.  There are no active problems to display for this patient.   History reviewed. No pertinent surgical history.      Home Medications    Prior to Admission medications   Medication Sig Start Date End Date Taking? Authorizing Provider  acetaminophen (TYLENOL) 500 MG tablet Take 1,000 mg by mouth every 6 (six) hours as needed for mild pain.   Yes [provider]  ibuprofen (ADVIL,MOTRIN) 200 MG tablet Take 200 mg by mouth every 6 (six) hours as needed for moderate pain.    Yes [provider]    Family History No family history on file.  Social History Social History   Tobacco Use  . Smoking status: Never Smoker  . Smokeless tobacco: Never Used  Substance Use Topics  . Alcohol use: Yes  . Drug use: Never     Allergies   Penicillins   Review of Systems Review of Systems 10 Systems reviewed and are negative for acute  change except as noted in the HPI.  Physical Exam Updated Vital Signs BP (!) 140/95   Pulse (!) 107   Temp 98.2 F (36.8 C) (Oral)   Resp 16   Ht 6\' 1"  (1.854 m)   Wt 97.5 kg   SpO2 97%   BMI 28.37 kg/m   Physical Exam Constitutional:      Appearance: He is well-developed.  HENT:     Head: Normocephalic and atraumatic.  Eyes:     Pupils: Pupils are equal, round, and reactive to light.  Neck:     Musculoskeletal: Neck supple.  Cardiovascular:     Rate and Rhythm: Normal rate and regular rhythm.     Heart sounds: Normal heart sounds.  Pulmonary:     Effort: Pulmonary effort is normal.     Breath sounds: Normal breath sounds.  Abdominal:     General: Bowel sounds are normal. There is no distension.     Palpations: Abdomen is soft.     Tenderness: There is abdominal tenderness. There is rebound.     Comments: Right lower quadrant pain with guarding.  Musculoskeletal: Normal range of motion.  Skin:    General: Skin is warm and dry.  Neurological:     Mental Status: He is alert and oriented to person, place, and time.     GCS: GCS eye subscore is 4. GCS verbal subscore is  5. GCS motor subscore is 6.     Coordination: Coordination normal.      ED Treatments / Results  Labs (all labs ordered are listed, but only abnormal results are displayed) Labs Reviewed  COMPREHENSIVE METABOLIC PANEL - Abnormal; Notable for the following components:      Result Value   Potassium 3.4 (*)    Glucose, Bld 110 (*)    Calcium 8.7 (*)    Total Bilirubin 1.7 (*)    All other components within normal limits  CBC WITH DIFFERENTIAL/PLATELET - Abnormal; Notable for the following components:   WBC 14.7 (*)    Neutro Abs 12.2 (*)    Monocytes Absolute 1.2 (*)    All other components within normal limits  LIPASE, BLOOD  URINALYSIS, ROUTINE W REFLEX MICROSCOPIC  I-STAT CG4 LACTIC ACID, ED  I-STAT CG4 LACTIC ACID, ED    EKG None  Radiology Ct Abdomen Pelvis W Contrast  Result  Date: 12/28/2017 CLINICAL DATA:  Right lower quadrant pain x2 days with fever and nausea. EXAM: CT ABDOMEN AND PELVIS WITH CONTRAST TECHNIQUE: Multidetector CT imaging of the abdomen and pelvis was performed using the standard protocol following bolus administration of intravenous contrast. CONTRAST:  OMNIPAQUE IOHEXOL 300 MG/ML  SOLN COMPARISON:  None. FINDINGS: Lower chest: No acute abnormality. Hepatobiliary: No focal liver abnormality is seen. No gallstones, gallbladder wall thickening, or biliary dilatation. Pancreas: Unremarkable. No pancreatic ductal dilatation or surrounding inflammatory changes. Spleen: Normal in size without focal abnormality. Adrenals/Urinary Tract: Adrenal glands are unremarkable. Kidneys are normal, without renal calculi, focal lesion, or hydronephrosis. Bladder is unremarkable. Stomach/Bowel: Appendix: Location: McBurney's point Diameter: 13 mm Appendicolith: None Mucosal hyper-enhancement: Yes Extraluminal gas: None Periappendiceal collection: Moderate periappendiceal inflammation, edema trace fluid without abscess. Decompressed stomach with small hiatal hernia. Normal small bowel rotation. No bowel obstruction. Mild submucosal fat deposition within the distal terminal ileum. Moderate stool retention the right colon. Scattered descending sigmoid diverticulosis without acute diverticulitis. Vascular/Lymphatic: No significant vascular findings are present. No enlarged abdominal or pelvic lymph nodes. Reproductive: Prostate is unremarkable. Other: No free air. Musculoskeletal: No acute or significant osseous findings. IMPRESSION: Acute appendicitis with moderate periappendiceal inflammation and trace fluid. No abscess or free air. Electronically Signed   By: Tollie Eth M.D.   On: 12/28/2017 14:44    Procedures Procedures (including critical care time)  Medications Ordered in ED Medications  sodium chloride 0.9 % bolus 1,000 mL (1,000 mLs Intravenous New Bag/Given 12/28/17  1215)    Followed by  0.9 %  sodium chloride infusion (has no administration in time range)  ciprofloxacin (CIPRO) IVPB 400 mg (has no administration in time range)  metroNIDAZOLE (FLAGYL) IVPB 500 mg (has no administration in time range)  sodium chloride 0.9 % bolus 1,000 mL (has no administration in time range)  morphine 4 MG/ML injection 4 mg (4 mg Intravenous Given 12/28/17 1216)  ondansetron (ZOFRAN) injection 4 mg (4 mg Intravenous Given 12/28/17 1217)  iohexol (OMNIPAQUE) 300 MG/ML solution 100 mL (100 mLs Intravenous Contrast Given 12/28/17 1419)     Initial Impression / Assessment and Plan / ED Course  I have reviewed the triage vital signs and the nursing notes.  Pertinent labs & imaging results that were available during my care of the patient were reviewed by me and considered in my medical decision making (see chart for details).     Presents with right lower quadrant pain.  CT scan confirms appendicitis.  Patient is otherwise healthy.  He is  tachycardic but nontoxic.  Fluids initiated.  Patient reports penicillin allergy.  Started on Cipro Flagyl.  General surgery consulted.  Final Clinical Impressions(s) / ED Diagnoses   Final diagnoses:  Acute appendicitis, unspecified acute appendicitis type    ED Discharge Orders    None       Arby BarrettePfeiffer, Mabel Unrein, MD 12/28/17 1506

## 2017-12-28 NOTE — Anesthesia Preprocedure Evaluation (Addendum)
Anesthesia Evaluation  Patient identified by MRN, date of birth, ID band Patient awake    Reviewed: Allergy & Precautions, NPO status , Patient's Chart, lab work & pertinent test results  Airway Mallampati: II  TM Distance: >3 FB Neck ROM: Full    Dental no notable dental hx.    Pulmonary neg pulmonary ROS,    Pulmonary exam normal breath sounds clear to auscultation       Cardiovascular negative cardio ROS   Rhythm:Regular Rate:Tachycardia     Neuro/Psych negative neurological ROS  negative psych ROS   GI/Hepatic negative GI ROS, Neg liver ROS,   Endo/Other  negative endocrine ROS  Renal/GU negative Renal ROS  negative genitourinary   Musculoskeletal negative musculoskeletal ROS (+)   Abdominal   Peds negative pediatric ROS (+)  Hematology negative hematology ROS (+)   Anesthesia Other Findings   Reproductive/Obstetrics negative OB ROS                            Anesthesia Physical Anesthesia Plan  ASA: I  Anesthesia Plan: General   Post-op Pain Management:    Induction: Intravenous  PONV Risk Score and Plan: 2 and Ondansetron, Dexamethasone and Treatment may vary due to age or medical condition  Airway Management Planned: Oral ETT  Additional Equipment:   Intra-op Plan:   Post-operative Plan: Extubation in OR  Informed Consent: I have reviewed the patients History and Physical, chart, labs and discussed the procedure including the risks, benefits and alternatives for the proposed anesthesia with the patient or authorized representative who has indicated his/her understanding and acceptance.   Dental advisory given  Plan Discussed with: CRNA and Surgeon  Anesthesia Plan Comments:         Anesthesia Quick Evaluation

## 2017-12-29 LAB — CBC
HCT: 45.3 % (ref 39.0–52.0)
HEMOGLOBIN: 15.4 g/dL (ref 13.0–17.0)
MCH: 29.5 pg (ref 26.0–34.0)
MCHC: 34 g/dL (ref 30.0–36.0)
MCV: 86.8 fL (ref 80.0–100.0)
Platelets: 217 10*3/uL (ref 150–400)
RBC: 5.22 MIL/uL (ref 4.22–5.81)
RDW: 12 % (ref 11.5–15.5)
WBC: 18.4 10*3/uL — ABNORMAL HIGH (ref 4.0–10.5)
nRBC: 0 % (ref 0.0–0.2)

## 2017-12-29 LAB — BASIC METABOLIC PANEL
Anion gap: 13 (ref 5–15)
BUN: 11 mg/dL (ref 6–20)
CO2: 23 mmol/L (ref 22–32)
Calcium: 8.2 mg/dL — ABNORMAL LOW (ref 8.9–10.3)
Chloride: 103 mmol/L (ref 98–111)
Creatinine, Ser: 1.11 mg/dL (ref 0.61–1.24)
GFR calc Af Amer: >60 mL/min (ref >60)
GFR calc non Af Amer: >60 mL/min (ref >60)
Glucose, Bld: 125 mg/dL — ABNORMAL HIGH (ref 70–99)
Potassium: 3.8 mmol/L (ref 3.5–5.1)
Sodium: 139 mmol/L (ref 135–145)

## 2017-12-29 MED ORDER — TRAMADOL HCL 50 MG PO TABS: 50.0000 mg | ORAL_TABLET | Freq: Four times a day (QID) | ORAL | 0 refills | Status: AC | PRN

## 2017-12-29 MED ORDER — METRONIDAZOLE 500 MG PO TABS: 500.0000 mg | ORAL_TABLET | Freq: Three times a day (TID) | ORAL | 0 refills | Status: AC

## 2017-12-29 MED ORDER — CIPROFLOXACIN HCL 500 MG PO TABS: 500.0000 mg | ORAL_TABLET | Freq: Two times a day (BID) | ORAL | 0 refills | Status: AC

## 2017-12-29 NOTE — Discharge Instructions (Signed)
[facility] ______[facility] °LAPAROSCOPIC SURGERY: POST OP INSTRUCTIONS °Always review your discharge instruction sheet given to you by the facility where your surgery was performed. °IF YOU HAVE DISABILITY OR FAMILY LEAVE FORMS, YOU MUST BRING THEM TO THE OFFICE FOR PROCESSING.   °DO NOT GIVE THEM TO YOUR DOCTOR. ° °1. A prescription for pain medication may be given to you upon discharge.  Take your pain medication as prescribed, if needed.  If narcotic pain medicine is not needed, then you may take acetaminophen (Tylenol) or ibuprofen (Advil) as needed. °2. Take your usually prescribed medications unless otherwise directed. °3. If you need a refill on your pain medication, please contact your pharmacy.  They will contact our office to request authorization. Prescriptions will not be filled after 5pm or on week-ends. °4. You should follow a light diet the first few days after arrival home, such as soup and crackers, etc.  Be sure to include lots of fluids daily. °5. Most patients will experience some swelling and bruising in the area of the incisions.  Ice packs will help.  Swelling and bruising can take several days to resolve.  °6. It is common to experience some constipation if taking pain medication after surgery.  Increasing fluid intake and taking a stool softener (such as Colace) will usually help or prevent this problem from occurring.  A mild laxative (Milk of Magnesia or Miralax) should be taken according to package instructions if there are no bowel movements after 48 hours. °7. Unless discharge instructions indicate otherwise, you may remove your bandages 24-48 hours after surgery, and you may shower at that time.  You may have steri-strips (small skin tapes) in place directly over the incision.  These strips should be left on the skin for 7-10 days.  If your surgeon used skin glue on the incision, you may shower in 24 hours.  The glue will flake off over the next 2-3 weeks.  Any sutures or  staples will be removed at the office during your follow-up visit. °8. ACTIVITIES:  You may resume regular (light) daily activities beginning the next day--such as daily self-care, walking, climbing stairs--gradually increasing activities as tolerated.  You may have sexual intercourse when it is comfortable.  Refrain from any heavy lifting or straining until approved by your doctor. °a. You may drive when you are no longer taking prescription pain medication, you can comfortably wear a seatbelt, and you can safely maneuver your car and apply brakes. °b. RETURN TO WORK:  __________________________________________________________ °9. You should see your doctor in the office for a follow-up appointment approximately 2-3 weeks after your surgery.  Make sure that you call for this appointment within a day or two after you arrive home to insure a convenient appointment time. °10. OTHER INSTRUCTIONS: __________________________________________________________________________________________________________________________ __________________________________________________________________________________________________________________________ °WHEN TO CALL YOUR DOCTOR: °1. Fever over 101.0 °2. Inability to urinate °3. Continued bleeding from incision. °4. Increased pain, redness, or drainage from the incision. °5. Increasing abdominal pain ° °The clinic staff is available to answer your questions during regular business hours.  Please don’t hesitate to call and ask to speak to one of the nurses for clinical concerns.  If you have a medical emergency, go to the nearest emergency room or call 911.  A surgeon from [facility] is always on call at the hospital. °[address], [address], [place], [place]  [address] ? [address] [address], [place],    [address] °[phone] ? [phone] ? FAX [phone] °Web site:   [email or web address] °

## 2017-12-29 NOTE — Plan of Care (Signed)
  Problem: Education: Goal: Knowledge of General Education information will improve Description: Including pain rating scale, medication(s)/side effects and non-pharmacologic comfort measures Outcome: Progressing   Problem: Activity: Goal: Risk for activity intolerance will decrease Outcome: Progressing   Problem: Nutrition: Goal: Adequate nutrition will be maintained Outcome: Progressing   Problem: Coping: Goal: Level of anxiety will decrease Outcome: Progressing   

## 2017-12-29 NOTE — Progress Notes (Signed)
Received patient from PACU, A&O x4, pain 4/10.  Incisions intact with skin glue, will continue to monitor.

## 2017-12-29 NOTE — Discharge Summary (Signed)
  Patient ID: Lonnie GlennRobert Stewart [id number] 42 y.o. 06/04/1975  Admit date: 12/28/2017  Discharge date and time: 12/29/2017  Admitting Physician: Ernestene MentionHaywood M Darnelle Derrick  Discharge Physician: Ernestene MentionHaywood M Sayvon Arterberry  Admission Diagnoses: Acute appendicitis, unspecified acute appendicitis type [K35.80]  Discharge Diagnoses: same  Operations: Procedure(s): APPENDECTOMY LAPAROSCOPIC  Admission Condition: fair  Discharged Condition: good  Indication for Admission: This is a 42 year old gentleman without prior medical problems who presented to the emergency room yesterday with abdominal pain pain localized to his right lower quadrant.  Some nausea but no emesis.  Evaluation in the ER revealed WBC 14,700 and CT scan positive for nonperforated appendicitis  Hospital Course: The patient was resuscitated in the ER, started on antibiotics and taken to the operating room by Dr. Doylene Canardonner.  Laparoscopic appendectomy was performed.  Fairly extensive purulent changes and localized peritonitis was noted but there was no obvious perforation or gangrene.  Appendectomy was performed laparoscopically stapling off the cuff of the cecum.    On postop day 1 the patient felt well and was ready to go home.  He was ambulatory.  Tolerating diet.  Voiding without difficulty.  Exam revealed the abdomen to be soft.  All wounds look good.  He was given instructions in diet and activities     I called in a prescription for tramadol, to his pharmacy.  I also called in a 5-day supply of Cipro and Flagyl to his pharmacy.    He was advised to call tomorrow and make an appointment to see us in the office in 2 weeks.  Consults: None  Significant Diagnostic Studies: angiography: X-ray, lab work, and surgical pathology  Treatments: surgery: laparoscopic appendectomy  Disposition: Home  Patient Instructions:  Allergies as of 12/29/2017      Reactions   Penicillins Other (See Comments)   Unknown childhood reaction DID THE REACTION  INVOLVE: Swelling of the face/tongue/throat, SOB, or low BP? Unknown Sudden or severe rash/hives, skin peeling, or the inside of the mouth or nose? Unknown Did it require medical treatment? Unknown When did it last happen? If all above answers are "NO", may proceed with cephalosporin use.      Medication List    TAKE these medications   acetaminophen 500 MG tablet Commonly known as:  TYLENOL Take 1,000 mg by mouth every 6 (six) hours as needed for mild pain.   ciprofloxacin 500 MG tablet Commonly known as:  CIPRO Take 1 tablet (500 mg total) by mouth 2 (two) times daily for 10 days.   ibuprofen 200 MG tablet Commonly known as:  ADVIL,MOTRIN Take 200 mg by mouth every 6 (six) hours as needed for moderate pain.   metroNIDAZOLE 500 MG tablet Commonly known as:  FLAGYL Take 1 tablet (500 mg total) by mouth 3 (three) times daily for 5 days.   traMADol 50 MG tablet Commonly known as:  ULTRAM Take 1 tablet (50 mg total) by mouth every 6 (six) hours as needed (mild pain).       Activity: no heavy lifting for 3 weeks Diet: regular diet Wound Care: as directed  Follow-up:  With [facility] clinic in 2 weeks    Addendum: I logged onto the [facility] website and reviewed his prescription medication history.  Signed: Angelia MouldHaywood M. Derrell LollingIngram, M.D., FACS General and minimally invasive surgery Breast and Colorectal Surgery  12/29/2017, 9:30 AM

## 2017-12-30 ENCOUNTER — Encounter (HOSPITAL_COMMUNITY): Payer: Self-pay | Admitting: Surgery

## 2017-12-30 ENCOUNTER — Telehealth: Payer: Self-pay | Admitting: *Deleted

## 2017-12-30 NOTE — Telephone Encounter (Signed)
[facility] from [facility] regarding medication clarification.  EDCM called MD office to confirm #20 to be dispensed.  Office RN reviewed chart to find correct order.  [facility] will [facility] to clarify.

## 2017-12-30 NOTE — Telephone Encounter (Signed)
EDCM spoke with Arlester MarkerPham D to clarify Rx as #20 instead of #10.

## 2017-12-31 NOTE — Anesthesia Postprocedure Evaluation (Signed)
Anesthesia Post Note  Patient: Lonnie GlennRobert Stewart  Procedure(s) Performed: APPENDECTOMY LAPAROSCOPIC (N/A Abdomen)     Patient location during evaluation: PACU Anesthesia Type: General Level of consciousness: awake and alert Pain management: pain level controlled Vital Signs Assessment: post-procedure vital signs reviewed and stable Respiratory status: spontaneous breathing, nonlabored ventilation, respiratory function stable and patient connected to nasal cannula oxygen Cardiovascular status: blood pressure returned to baseline and stable Postop Assessment: no apparent nausea or vomiting Anesthetic complications: no    Last Vitals:  Vitals:   12/29/17 1041 12/29/17 1300  BP: 130/89 (!) 128/92  Pulse: 87 (!) 101  Resp: 16 17  Temp: 36.8 C 36.7 C  SpO2: 100% 100%    Last Pain:  Vitals:   12/29/17 1300  TempSrc: Oral  PainSc:                  [name]

## 2019-09-07 IMAGING — CT CT ABD-PELV W/ CM
2 of 5 series · 16 of 46 positions shown, 18 images · IV contrast (APPLIED)
Comparison: None.

CLINICAL DATA: Right lower quadrant pain x2 days with fever and
nausea.

EXAM:
CT ABDOMEN AND PELVIS WITH CONTRAST
TECHNIQUE: Multidetector CT imaging of the abdomen and pelvis was performed
using the standard protocol following bolus administration of
intravenous contrast.
CONTRAST:  100mL OMNIPAQUE IOHEXOL 300 MG/ML  SOLN

[Series 3: abdomen 5.0 · axial · 0.75mm/px · z∈[+690,+1110]mm · 13 of 98 slices shown, 15 images]
[im 7/98  soft-tissue]
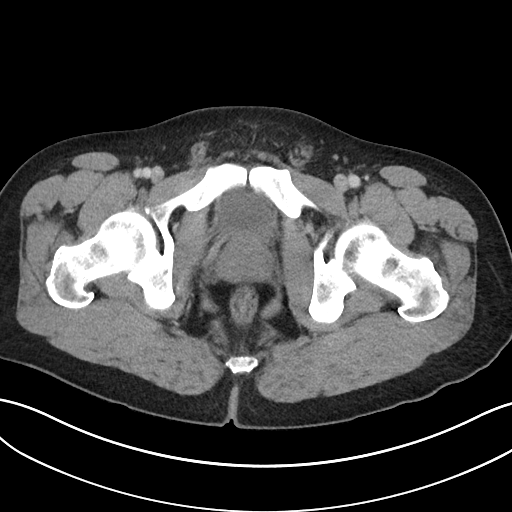
[im 7/98  bone]
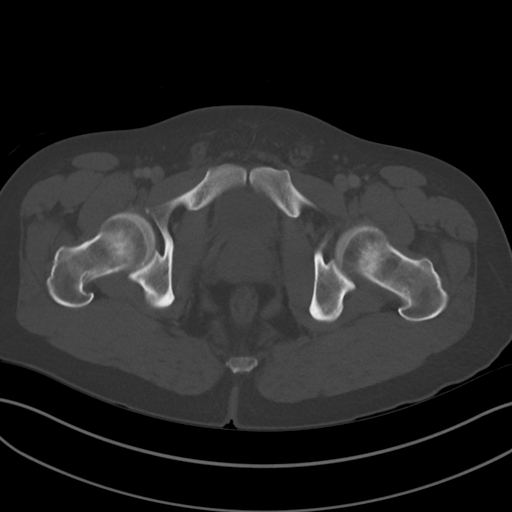
[im 14/98  soft-tissue]
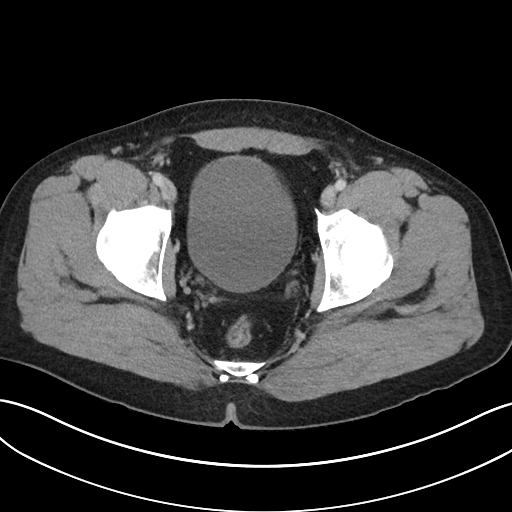
[im 21/98  soft-tissue]
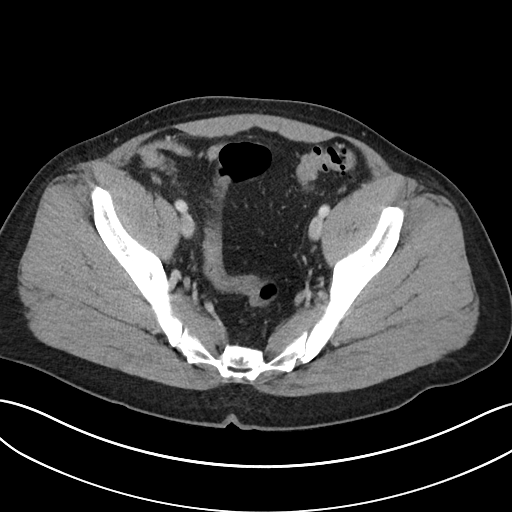
[im 28/98  soft-tissue]
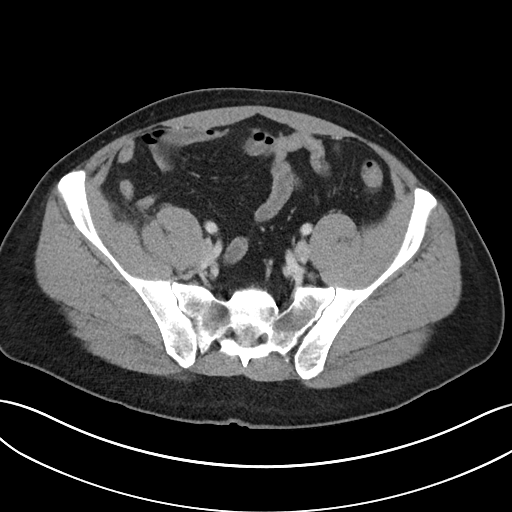
[im 35/98  soft-tissue]
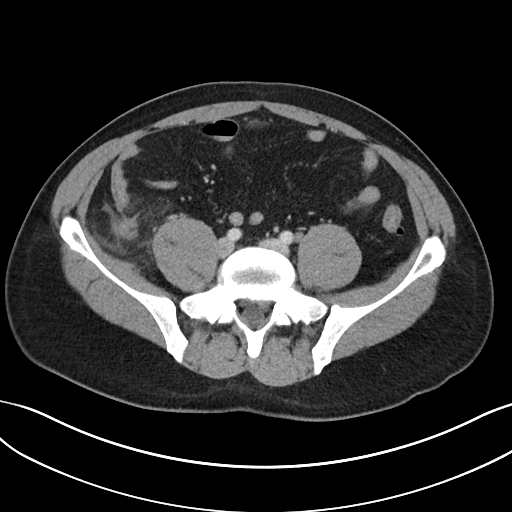
[im 42/98  soft-tissue]
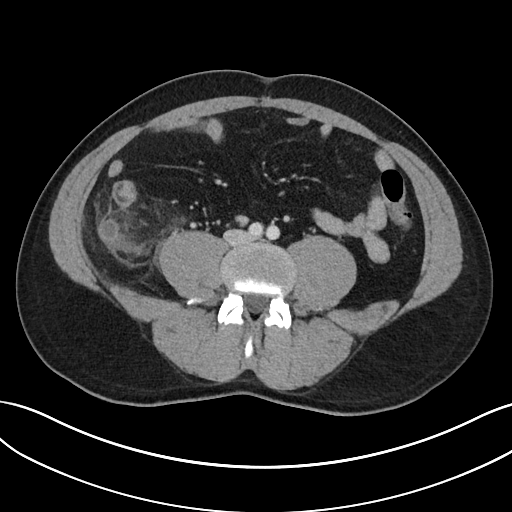
[im 49/98  soft-tissue]
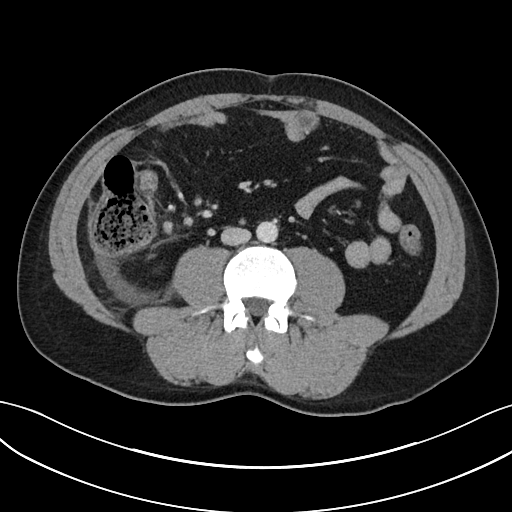
[im 56/98  soft-tissue]
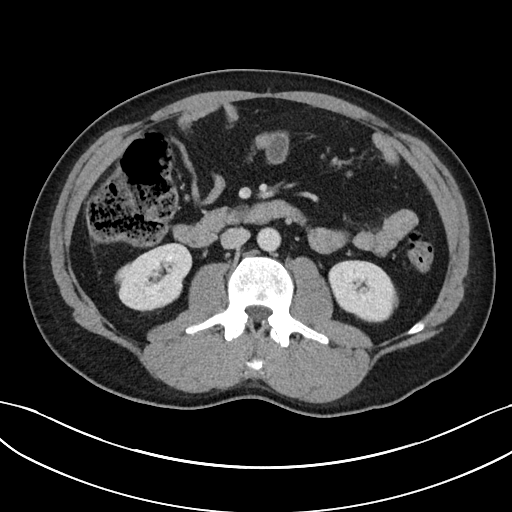
[im 63/98  soft-tissue]
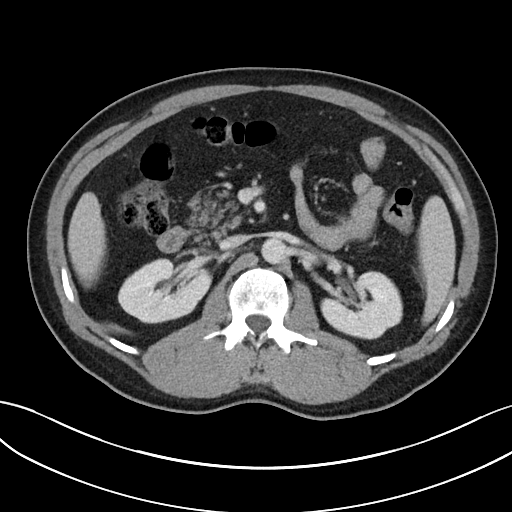
[im 63/98  bone]
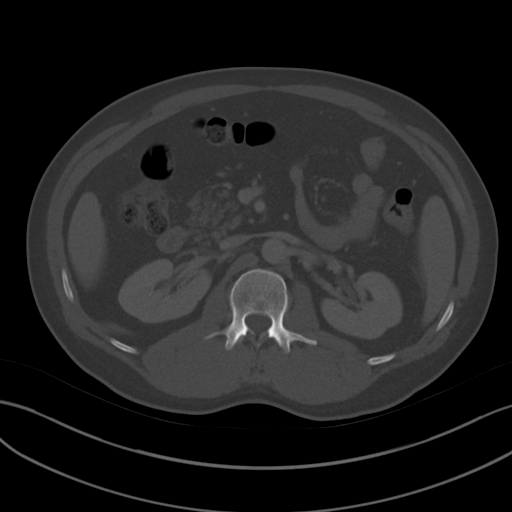
[im 70/98  soft-tissue]
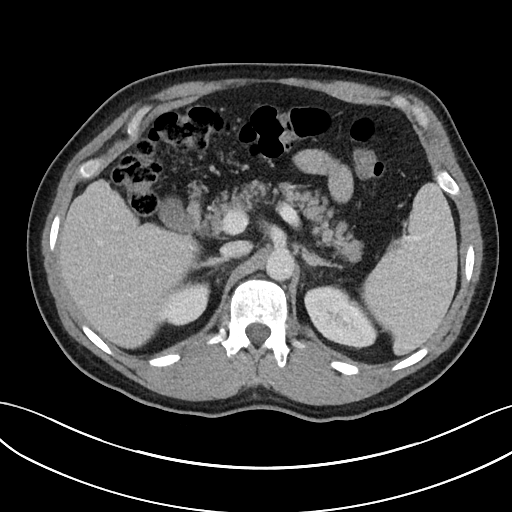
[im 77/98  soft-tissue]
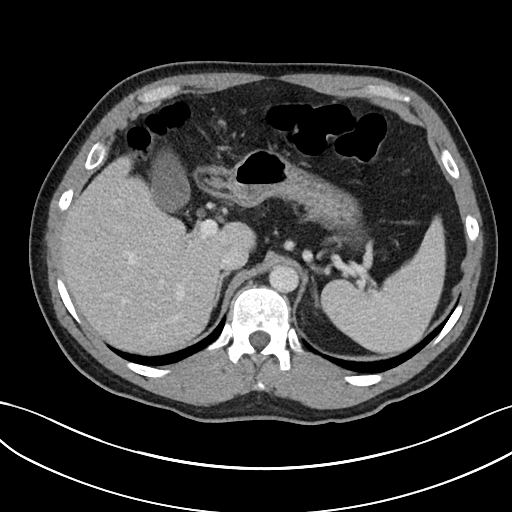
[im 84/98  soft-tissue]
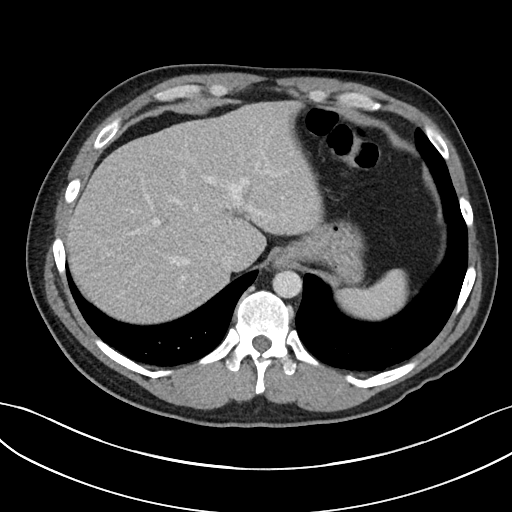
[im 91/98  soft-tissue]
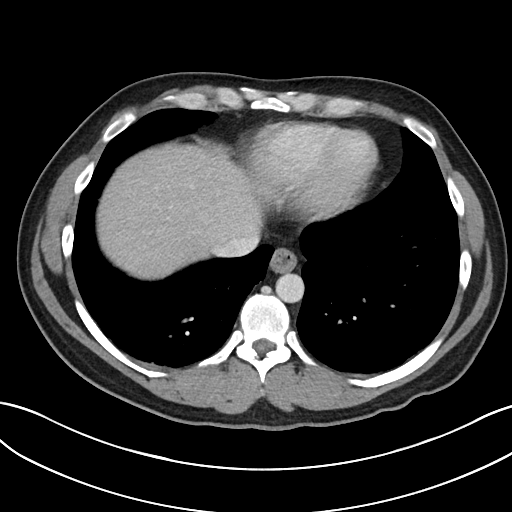

[Series 6: abdomen 3.0 mpr cor · coronal · 0.89mm/px · 3 of 110 slices shown]
[im 37/110  soft-tissue]
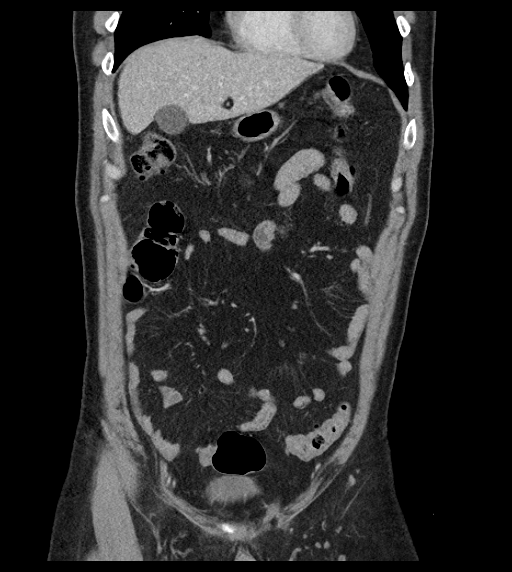
[im 49/110  soft-tissue]
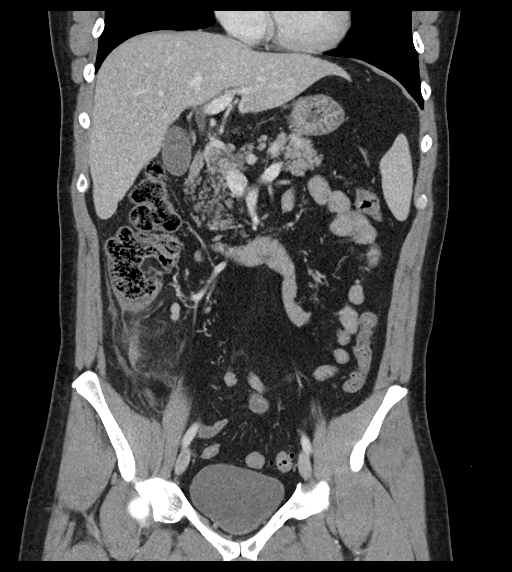
[im 61/110  soft-tissue]
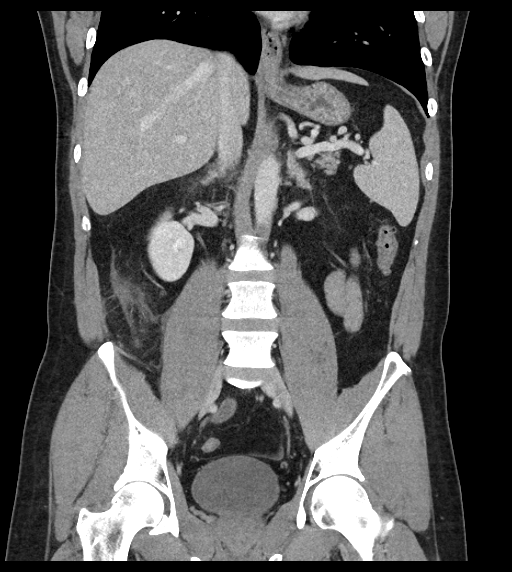

[16 of 46 positions shown; findings below may reference images not displayed]

FINDINGS: Lower chest: No acute abnormality.

Hepatobiliary: No focal liver abnormality is seen. No gallstones,
gallbladder wall thickening, or biliary dilatation.

Pancreas: Unremarkable. No pancreatic ductal dilatation or
surrounding inflammatory changes.

Spleen: Normal in size without focal abnormality.

Adrenals/Urinary Tract: Adrenal glands are unremarkable. Kidneys are
normal, without renal calculi, focal lesion, or hydronephrosis.
Bladder is unremarkable.

Stomach/Bowel:

Appendix: Location: McBurney's point

Diameter: 13 mm

Appendicolith: None

Mucosal hyper-enhancement: Yes

Extraluminal gas: None

Periappendiceal collection: Moderate periappendiceal inflammation,
edema trace fluid without abscess.

Decompressed stomach with small hiatal hernia. Normal small bowel
rotation. No bowel obstruction. Mild submucosal fat deposition
within the distal terminal ileum. Moderate stool retention the right
colon. Scattered descending sigmoid diverticulosis without acute
diverticulitis.

Vascular/Lymphatic: No significant vascular findings are present. No
enlarged abdominal or pelvic lymph nodes.

Reproductive: Prostate is unremarkable.

Other: No free air.

Musculoskeletal: No acute or significant osseous findings.
IMPRESSION: Acute appendicitis with moderate periappendiceal inflammation and
trace fluid. No abscess or free air.

## 2022-09-18 LAB — EXTERNAL GENERIC LAB PROCEDURE: COLOGUARD: NEGATIVE

## 2022-10-06 ENCOUNTER — Ambulatory Visit
Admission: EM | Admit: 2022-10-06 | Discharge: 2022-10-06 | Disposition: A | Discharge status: Home / Self Care | Payer: BC Managed Care – PPO

## 2022-10-06 DIAGNOSIS — Z1152 Encounter for screening for COVID-19: Secondary | ICD-10-CM | POA: Insufficient documentation

## 2022-10-06 DIAGNOSIS — J069 Acute upper respiratory infection, unspecified: Secondary | ICD-10-CM | POA: Diagnosis present

## 2022-10-06 LAB — POCT INFLUENZA A/B
Influenza A, POC: NEGATIVE
Influenza B, POC: NEGATIVE

## 2022-10-06 NOTE — ED Triage Notes (Signed)
Pt c/o sore throat x 2 days ago, cough, congestion and body aches x 1 day. Negative COVID test yesterday

## 2022-10-06 NOTE — ED Provider Notes (Signed)
[facility]    CSN: [id number] Arrival date & time: 10/06/22  1850      History   Chief Complaint No chief complaint on file.   HPI Lonnie Stewart is a 47 y.o. male.   Patient presenting today with 2-day history of sore throat, cough, congestion, body aches, chills.  Denies chest pain, shortness of breath, abdominal pain, nausea vomiting or diarrhea.  So far trying Mucinex DM with minimal relief.  No known sick contacts recently.    History reviewed. No pertinent past medical history.  Patient Active Problem List   Diagnosis Date Noted   Appendicitis 12/28/2017    Past Surgical History:  Procedure Laterality Date   LAPAROSCOPIC APPENDECTOMY N/A 12/28/2017   Procedure: APPENDECTOMY LAPAROSCOPIC;  Surgeon: Berna Bue, MD;  Location: [facility] OR;  Service: General;  Laterality: N/A;       Home Medications    Prior to Admission medications   Medication Sig Start Date End Date Taking? Authorizing Provider  acetaminophen (TYLENOL) 500 MG tablet Take 1,000 mg by mouth every 6 (six) hours as needed for mild pain.    [provider]  ibuprofen (ADVIL,MOTRIN) 200 MG tablet Take 200 mg by mouth every 6 (six) hours as needed for moderate pain.     [provider]  olmesartan-hydrochlorothiazide (BENICAR HCT) 20-12.5 MG tablet Take 1 tablet by mouth daily.    [provider]  traMADol (ULTRAM) 50 MG tablet Take 1 tablet (50 mg total) by mouth every 6 (six) hours as needed (mild pain). 12/29/17   Claud Kelp, MD    Family History History reviewed. No pertinent family history.  Social History Social History   Tobacco Use   Smoking status: Never   Smokeless tobacco: Never  Substance Use Topics   Alcohol use: Yes   Drug use: Never     Allergies   Penicillins   Review of Systems Review of Systems Per HPI  Physical Exam Triage Vital Signs ED Triage Vitals  Encounter Vitals Group     BP 10/06/22 1901 126/85      Systolic BP Percentile --      Diastolic BP Percentile --      Pulse Rate 10/06/22 1901 (!) 112     Resp 10/06/22 1901 16     Temp 10/06/22 1901 98.8 F (37.1 C)     Temp Source 10/06/22 1901 Oral     SpO2 10/06/22 1901 95 %     Weight --      Height --      Head Circumference --      Peak Flow --      Pain Score 10/06/22 1902 3     Pain Loc --      Pain Education --      Exclude from Growth Chart --    No data found.  Updated Vital Signs BP 126/85 (BP Location: Right Arm)   Pulse (!) 112   Temp 98.8 F (37.1 C) (Oral)   Resp 16   SpO2 95%   Visual Acuity Right Eye Distance:   Left Eye Distance:   Bilateral Distance:    Right Eye Near:   Left Eye Near:    Bilateral Near:     Physical Exam Vitals and nursing note reviewed.  Constitutional:      Appearance: He is well-developed.  HENT:     Head: Atraumatic.     Right Ear: External ear normal.     Left Ear: External ear  normal.     Nose: Rhinorrhea present.     Mouth/Throat:     Pharynx: Posterior oropharyngeal erythema present. No oropharyngeal exudate.  Eyes:     Conjunctiva/sclera: Conjunctivae normal.     Pupils: Pupils are equal, round, and reactive to light.  Cardiovascular:     Rate and Rhythm: Normal rate and regular rhythm.  Pulmonary:     Effort: Pulmonary effort is normal. No respiratory distress.     Breath sounds: No wheezing or rales.  Musculoskeletal:        General: Normal range of motion.     Cervical back: Normal range of motion and neck supple.  Lymphadenopathy:     Cervical: No cervical adenopathy.  Skin:    General: Skin is warm and dry.  Neurological:     Mental Status: He is alert and oriented to person, place, and time.  Psychiatric:        Behavior: Behavior normal.      UC Treatments / Results  Labs (all labs ordered are listed, but only abnormal results are displayed) Labs Reviewed  SARS CORONAVIRUS 2 (TAT 6-24 HRS)  POCT INFLUENZA A/B    EKG   Radiology No  results found.  Procedures Procedures (including critical care time)  Medications Ordered in UC Medications - No data to display  Initial Impression / Assessment and Plan / UC Course  I have reviewed the triage vital signs and the nursing notes.  Pertinent labs & imaging results that were available during my care of the patient were reviewed by me and considered in my medical decision making (see chart for details).     Mildly tachycardic in triage, otherwise vital signs within normal limits.  He is well-appearing and in no acute distress.  Suspect viral respiratory infection.  Treat with supportive over-the-counter medications, home care and await COVID testing.  Rapid flu negative.  Good candidate for Paxlovid if positive.  Final Clinical Impressions(s) / UC Diagnoses   Final diagnoses:  Viral URI with cough     Discharge Instructions      Flu test was negative, we have sent out a COVID test that should be back tomorrow.  If it is positive, someone will reach out and discuss if you would like antiviral therapy sent in.  In the meantime, take over-the-counter cold and congestion medications, pain and fever reducers.     ED Prescriptions   None    PDMP not reviewed this encounter.   Particia Nearing, [place] 10/06/22 1954

## 2022-10-06 NOTE — Discharge Instructions (Signed)
Flu test was negative, we have sent out a COVID test that should be back tomorrow.  If it is positive, someone will reach out and discuss if you would like antiviral therapy sent in.  In the meantime, take over-the-counter cold and congestion medications, pain and fever reducers.

## 2022-10-07 LAB — SARS CORONAVIRUS 2 (TAT 6-24 HRS): SARS Coronavirus 2: NEGATIVE
